# Patient Record
Sex: Male | Born: 1995 | Race: Black or African American | Hispanic: No | Marital: Single | Smoking: Never smoker
Health system: Southern US, Community
[De-identification: ages and names within clinical notes are randomized; demographics above are authoritative.]

---

## 2018-12-26 ENCOUNTER — Other Ambulatory Visit: Payer: Self-pay

## 2018-12-26 ENCOUNTER — Encounter (HOSPITAL_COMMUNITY): Payer: Self-pay | Admitting: *Deleted

## 2018-12-26 ENCOUNTER — Ambulatory Visit (HOSPITAL_COMMUNITY)
Admission: EM | Admit: 2018-12-26 | Discharge: 2018-12-26 | Disposition: A | Discharge status: Home / Self Care | Payer: BLUE CROSS/BLUE SHIELD | Attending: Emergency Medicine | Admitting: Emergency Medicine

## 2018-12-26 ENCOUNTER — Ambulatory Visit (INDEPENDENT_AMBULATORY_CARE_PROVIDER_SITE_OTHER): Payer: BLUE CROSS/BLUE SHIELD

## 2018-12-26 DIAGNOSIS — M25571 Pain in right ankle and joints of right foot: Secondary | ICD-10-CM

## 2018-12-26 MED ORDER — IBUPROFEN 600 MG PO TABS: 600.0000 mg | ORAL_TABLET | Freq: Four times a day (QID) | ORAL | 0 refills | Status: AC | PRN

## 2018-12-26 NOTE — ED Provider Notes (Signed)
[facility]    CSN: [id number] Arrival date & time: 12/26/18  1556     History   Chief Complaint Chief Complaint  Patient presents with  . Ankle Injury    HPI Clayton Kelly is a 23 y.o. male.   Patient presents with right lateral ankle pain x1 day which began when he twisted his ankle while playing tennis.  He states he stepped wrong and his foot inverted.  He denies head injury or LOC.  He denies numbness, paresthesias, weakness in his foot or leg.  He states the pain is worse with weightbearing and improves with rest.  He has not taken any over-the-counter medications for his discomfort.  He denies any pertinent medical history.    The history is provided by the patient.    History reviewed. No pertinent past medical history.  There are no active problems to display for this patient.   History reviewed. No pertinent surgical history.     Home Medications    Prior to Admission medications   Medication Sig Start Date End Date Taking? Authorizing Provider  ibuprofen (ADVIL) 600 MG tablet Take 1 tablet (600 mg total) by mouth every 6 (six) hours as needed. 12/26/18   Mickie Bailate, Tashena Ibach H, NP    Family History Family History  Problem Relation Age of Onset  . Healthy Mother   . Healthy Father     Social History Social History   Tobacco Use  . Smoking status: Never Smoker  . Smokeless tobacco: Never Used  Substance Use Topics  . Alcohol use: Yes    Comment: occasionally  . Drug use: Not on file     Allergies   Patient has no known allergies.   Review of Systems Review of Systems  Constitutional: Negative for chills and fever.  HENT: Negative for ear pain and sore throat.   Eyes: Negative for pain and visual disturbance.  Respiratory: Negative for cough and shortness of breath.   Cardiovascular: Negative for chest pain and palpitations.  Gastrointestinal: Negative for abdominal pain and vomiting.  Genitourinary: Negative for dysuria and hematuria.   Musculoskeletal: Positive for arthralgias. Negative for back pain.  Skin: Negative for color change and rash.  Neurological: Negative for seizures and syncope.  All other systems reviewed and are negative.    Physical Exam Triage Vital Signs ED Triage Vitals  Enc Vitals Group     BP 12/26/18 1651 113/77     Pulse Rate 12/26/18 1651 95     Resp 12/26/18 1651 16     Temp 12/26/18 1651 98.3 F (36.8 C)     Temp Source 12/26/18 1651 Oral     SpO2 12/26/18 1651 97 %     Weight --      Height --      Head Circumference --      Peak Flow --      Pain Score 12/26/18 1653 2     Pain Loc --      Pain Edu? --      Excl. in GC? --    No data found.  Updated Vital Signs BP 113/77   Pulse 95   Temp 98.3 F (36.8 C) (Oral)   Resp 16   SpO2 97%   Visual Acuity Right Eye Distance:   Left Eye Distance:   Bilateral Distance:    Right Eye Near:   Left Eye Near:    Bilateral Near:     Physical Exam Vitals signs and nursing note  reviewed.  Constitutional:      Appearance: He is well-developed.  HENT:     Head: Normocephalic and atraumatic.  Eyes:     Conjunctiva/sclera: Conjunctivae normal.  Neck:     Musculoskeletal: Neck supple.  Cardiovascular:     Rate and Rhythm: Normal rate and regular rhythm.     Heart sounds: No murmur.  Pulmonary:     Effort: Pulmonary effort is normal. No respiratory distress.     Breath sounds: Normal breath sounds.  Abdominal:     Palpations: Abdomen is soft.     Tenderness: There is no abdominal tenderness.  Musculoskeletal:        General: Swelling and tenderness present.     Comments: Right lateral ankle tender and edematous.   RLE: Sensation intact, 2+ pulses, no open wounds.  ROM in ankle limited by pain; moves toes without difficulty.  Skin:    General: Skin is warm and dry.     Findings: No bruising, erythema or lesion.  Neurological:     Mental Status: He is alert.     Sensory: No sensory deficit.     Motor: No weakness.       UC Treatments / Results  Labs (all labs ordered are listed, but only abnormal results are displayed) Labs Reviewed - No data to display  EKG   Radiology Dg Ankle Complete Right  Result Date: 12/26/2018 CLINICAL DATA:  Pain and swelling following fall EXAM: RIGHT ANKLE - COMPLETE 3+ VIEW COMPARISON:  None. FINDINGS: Frontal, oblique, and lateral views were obtained. There is soft tissue swelling. No fracture or joint effusion evident. There is no appreciable joint space narrowing or erosion. Ankle mortise appears intact. IMPRESSION: Soft tissue swelling. No evident fracture or appreciable arthropathy. Ankle mortise appears intact. Electronically Signed   By: Bretta BangWilliam  Woodruff III M.D.   On: 12/26/2018 17:15    Procedures Procedures (including critical care time)  Medications Ordered in UC Medications - No data to display  Initial Impression / Assessment and Plan / UC Course  I have reviewed the triage vital signs and the nursing notes.  Pertinent labs & imaging results that were available during my care of the patient were reviewed by me and considered in my medical decision making (see chart for details).   Right ankle pain.  X-ray negative.  Treating with rest, ice, elevation, Ace wrap, crutches, and ibuprofen as needed.  Discussed with patient that he should follow-up with an orthopedist if his pain continues.  Discussed that he should go to the emergency department if he develops severe acute pain, numbness, paresthesias, weakness in his foot or leg.     Final Clinical Impressions(s) / UC Diagnoses   Final diagnoses:  Acute right ankle pain     Discharge Instructions     Your x-ray did not show any broken bones.    Rest and elevate your ankle.  Use ice packs as directed.  Wear the Ace bandage and use the crutches as needed for comfort.  Take the ibuprofen as prescribed if you need it for pain.    Follow-up with an orthopedist such as the one listed if your pain  continues or worsens.    To the emergency department if you develop acute severe pain, numbness, tingling, weakness in your foot or leg.        ED Prescriptions    Medication Sig Dispense Auth. Provider   ibuprofen (ADVIL) 600 MG tablet Take 1 tablet (600 mg total) by mouth  every 6 (six) hours as needed. 30 tablet Sharion Balloon, NP     Controlled Substance Prescriptions [place] Controlled Substance Registry consulted? Yes, I have consulted the [place] Controlled Substances Registry for this patient, and feel the risk/benefit ratio today is favorable for proceeding with this prescription for a controlled substance.   Sharion Balloon, NP 12/26/18 [phone]

## 2018-12-26 NOTE — Discharge Instructions (Addendum)
Your x-ray did not show any broken bones.    Rest and elevate your ankle.  Use ice packs as directed.  Wear the Ace bandage and use the crutches as needed for comfort.  Take the ibuprofen as prescribed if you need it for pain.    Follow-up with an orthopedist such as the one listed if your pain continues or worsens.    To the emergency department if you develop acute severe pain, numbness, tingling, weakness in your foot or leg.

## 2018-12-26 NOTE — ED Triage Notes (Signed)
Reports rolling right ankle while playing tennis yesterday.  C/o pain, swelling to right lateral ankle with difficulty bearing weight.

## 2018-12-27 ENCOUNTER — Encounter (HOSPITAL_COMMUNITY): Payer: Self-pay

## 2020-08-20 IMAGING — DX RIGHT ANKLE - COMPLETE 3+ VIEW
3 series · 3 of 3 positions shown · non-contrast
Comparison: None.

CLINICAL DATA: Pain and swelling following fall

EXAM:
RIGHT ANKLE - COMPLETE 3+ VIEW

[ankle ap]
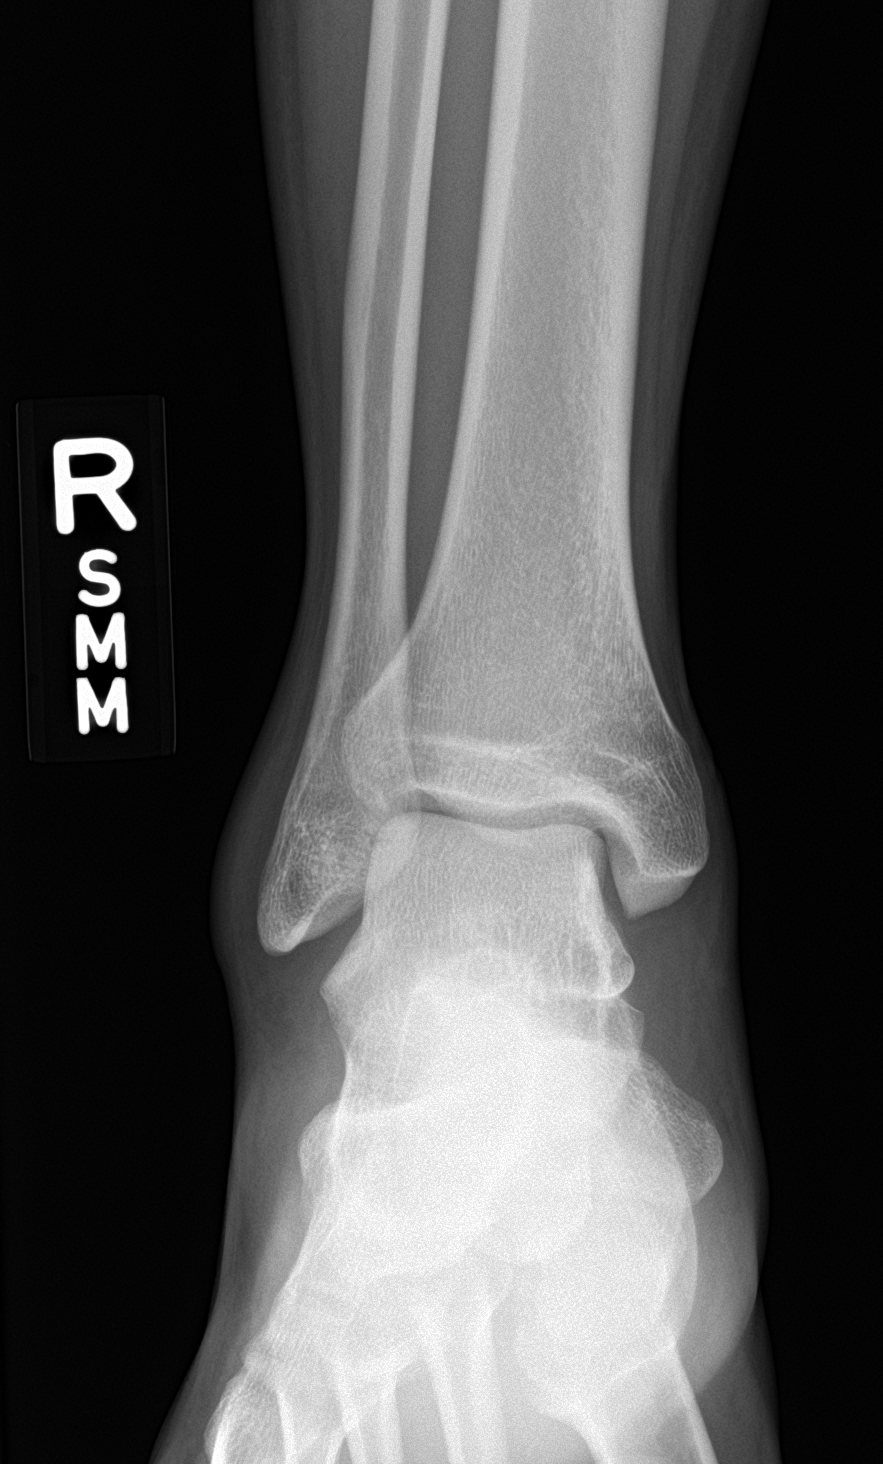

[ankle obl]
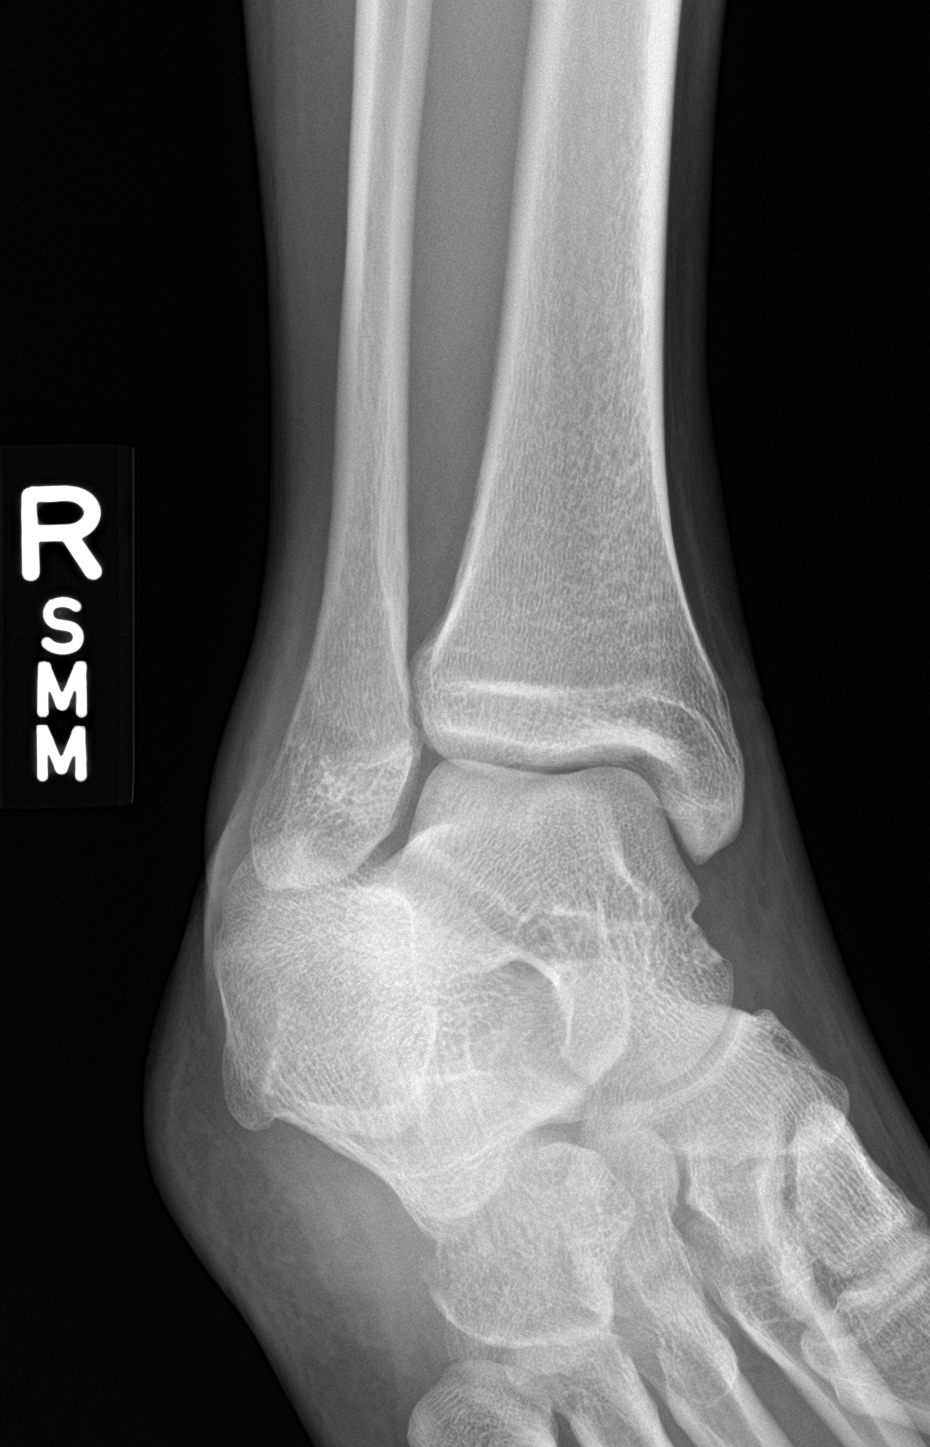

[ankle lat]
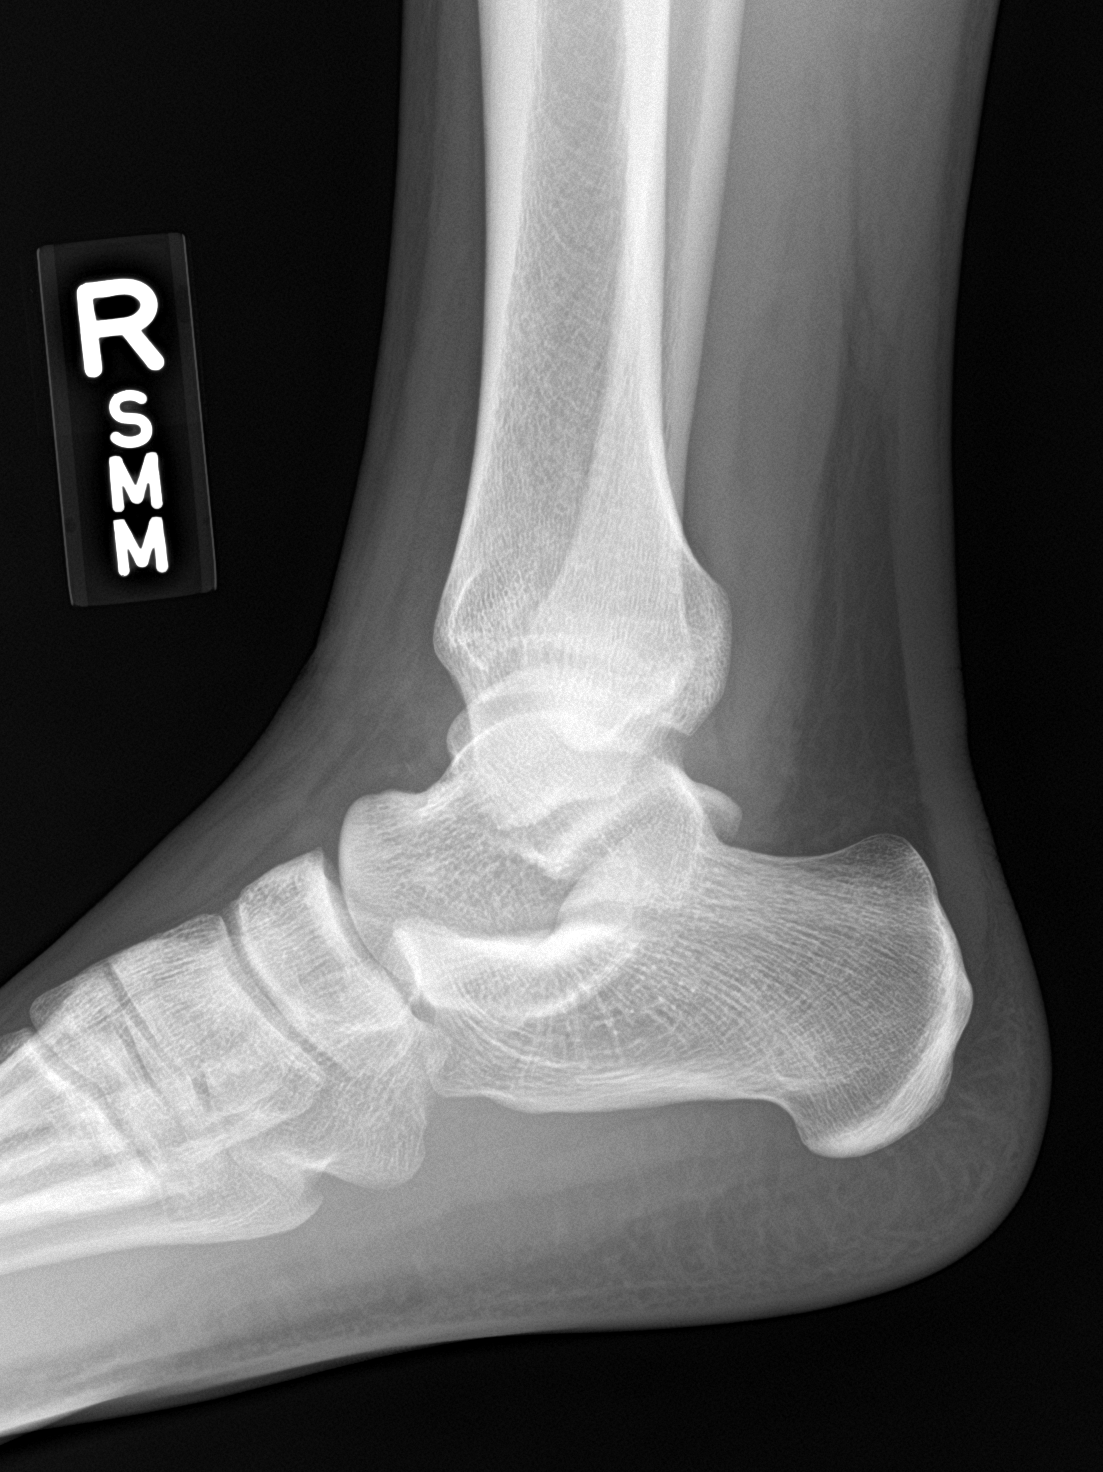

[3 of 3 positions shown; findings below may reference images not displayed]

FINDINGS: Frontal, oblique, and lateral views were obtained. There is soft
tissue swelling. No fracture or joint effusion evident. There is no
appreciable joint space narrowing or erosion. Ankle mortise appears
intact.
IMPRESSION: Soft tissue swelling. No evident fracture or appreciable
arthropathy. Ankle mortise appears intact.
